# Patient Record
Sex: Female | Born: 2000 | Race: White | Hispanic: No | Marital: Single | Smoking: Never smoker
Health system: Southern US, Community
[De-identification: ages and names within clinical notes are randomized; demographics above are authoritative.]

## PROBLEM LIST (undated history)

## (undated) HISTORY — PX: MOUTH SURGERY: SHX715

---
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
## 2018-12-19 ENCOUNTER — Emergency Department (HOSPITAL_COMMUNITY)
Admission: EM | Admit: 2018-12-19 | Discharge: 2018-12-19 | Disposition: A | Discharge status: Home / Self Care | Payer: 59 | Attending: Emergency Medicine | Admitting: Emergency Medicine

## 2018-12-19 ENCOUNTER — Other Ambulatory Visit: Payer: Self-pay

## 2018-12-19 ENCOUNTER — Emergency Department (HOSPITAL_COMMUNITY): Payer: 59

## 2018-12-19 DIAGNOSIS — Z79899 Other long term (current) drug therapy: Secondary | ICD-10-CM | POA: Diagnosis not present

## 2018-12-19 DIAGNOSIS — Y939 Activity, unspecified: Secondary | ICD-10-CM | POA: Diagnosis not present

## 2018-12-19 DIAGNOSIS — S62022A Displaced fracture of middle third of navicular [scaphoid] bone of left wrist, initial encounter for closed fracture: Secondary | ICD-10-CM

## 2018-12-19 DIAGNOSIS — Y999 Unspecified external cause status: Secondary | ICD-10-CM | POA: Insufficient documentation

## 2018-12-19 DIAGNOSIS — S6992XA Unspecified injury of left wrist, hand and finger(s), initial encounter: Secondary | ICD-10-CM | POA: Diagnosis present

## 2018-12-19 DIAGNOSIS — Y92410 Unspecified street and highway as the place of occurrence of the external cause: Secondary | ICD-10-CM | POA: Diagnosis not present

## 2018-12-19 DIAGNOSIS — S62024A Nondisplaced fracture of middle third of navicular [scaphoid] bone of right wrist, initial encounter for closed fracture: Secondary | ICD-10-CM | POA: Insufficient documentation

## 2018-12-19 MED ORDER — HYDROCODONE-ACETAMINOPHEN 5-325 MG PO TABS: 2.0000 | ORAL_TABLET | ORAL | 0 refills | Status: AC | PRN

## 2018-12-19 MED ORDER — HYDROCODONE-ACETAMINOPHEN 5-325 MG PO TABS
1.0000 | ORAL_TABLET | Freq: Once | ORAL | Status: AC
  Administered 2018-12-19: 22:00:00 1 via ORAL
  Filled 2018-12-19: qty 1

## 2018-12-19 NOTE — ED Notes (Signed)
An After Visit Summary was printed and given to the patient. Discharge instructions given and no further questions at this time.  
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
## 2018-12-19 NOTE — Discharge Instructions (Signed)
Your left wrist was placed today on a splint, please keep this on until you follow-up with Ortho.  Dr. Dallie Piles, orthopedist contact information is attached to your chart, please schedule an appointment for further management of your scaphoid fracture.  I have provided a short course of pain medication, do not drink alcohol or drive while taking this medication as he can make you drowsy.

## 2018-12-24 ENCOUNTER — Ambulatory Visit
Admission: RE | Admit: 2018-12-24 | Discharge: 2018-12-24 | Disposition: A | Discharge status: Home / Self Care | Payer: 59 | Source: Ambulatory Visit | Attending: Orthopedic Surgery | Admitting: Orthopedic Surgery

## 2018-12-24 ENCOUNTER — Other Ambulatory Visit: Payer: Self-pay | Admitting: Orthopedic Surgery

## 2018-12-24 ENCOUNTER — Other Ambulatory Visit: Payer: Self-pay

## 2018-12-24 DIAGNOSIS — S62035A Nondisplaced fracture of proximal third of navicular [scaphoid] bone of left wrist, initial encounter for closed fracture: Secondary | ICD-10-CM

## 2020-06-28 ENCOUNTER — Encounter (HOSPITAL_COMMUNITY): Payer: Self-pay

## 2020-06-28 ENCOUNTER — Other Ambulatory Visit: Payer: Self-pay

## 2020-06-28 ENCOUNTER — Ambulatory Visit (HOSPITAL_COMMUNITY)
Admission: EM | Admit: 2020-06-28 | Discharge: 2020-06-28 | Disposition: A | Discharge status: Home / Self Care | Payer: 59 | Attending: Family Medicine | Admitting: Family Medicine

## 2020-06-28 DIAGNOSIS — S61214A Laceration without foreign body of right ring finger without damage to nail, initial encounter: Secondary | ICD-10-CM

## 2020-06-28 MED ORDER — LIDOCAINE HCL 2 % IJ SOLN
INTRAMUSCULAR | Status: AC
  Filled 2020-06-28: qty 20

## 2020-06-28 NOTE — ED Triage Notes (Signed)
Pt in with c/o right ring finger laceration that happened today when she was opening a can of soup  States she thinks her last tetanus shot was about 4-5 years ago

## 2020-07-01 NOTE — ED Provider Notes (Signed)
  [facility]   [id number] 06/28/20 Arrival Time: 1746  ASSESSMENT & PLAN:  1. Laceration of right ring finger without foreign body without damage to nail, initial encounter      Procedure: Verbal consent obtained. Patient provided with risks and alternatives to the procedure. Wound copiously irrigated with NS then cleansed with betadine. Digital block: Lidocaine 1% without epinephrine. Wound carefully explored. No foreign body, tendon injury, or nonviable tissue were noted. Using sterile technique, 2 interrupted 5-0 Prolene sutures were placed to reapproximate the wound. Procedure tolerated well. No complications. Minimal bleeding. Advised to look for and return for any signs of infection such as redness, swelling, discharge, or worsening pain. Return for suture removal in 7 days.   Follow-up Information    [facility].   Specialty: Urgent Care Why: Return in 7 days for suture removal. Contact information: [address] [place] [address] [phone]              Reviewed expectations re: course of current medical issues. Questions answered. Outlined signs and symptoms indicating need for more acute intervention. Patient verbalized understanding. After Visit Summary given.   SUBJECTIVE:  Barbara Vaughn is a 20 y.o. female who presents with a laceration of R 4th finger. Today. Cut on can. Bleeding controlled. No ROM described. No extremity sensation changes or weakness.   Td UTD: 4-5 y ago.  OBJECTIVE:  Vitals:   06/28/20 1837  BP: 120/87  Pulse: 93  Resp: 17  Temp: 98.1 F (36.7 C)  SpO2: 100%     General appearance: alert; no distress R 4th finger: linear laceration of volar mid finger; size: approx 1 cm; clean wound edges, no foreign bodies; without active bleeding; ROM is limited secondary to reported pain Psychological: alert and cooperative; normal mood and affect    No Known Allergies  History  reviewed. No pertinent past medical history. Social History   Socioeconomic History  . Marital status: Single    Spouse name: Not on file  . Number of children: Not on file  . Years of education: Not on file  . Highest education level: Not on file  Occupational History  . Not on file  Tobacco Use  . Smoking status: Never Smoker  . Smokeless tobacco: Never Used  Substance and Sexual Activity  . Alcohol use: Yes    Comment: rare  . Drug use: No  . Sexual activity: Not on file  Other Topics Concern  . Not on file  Social History Narrative  . Not on file   Social Determinants of Health   Financial Resource Strain: Not on file  Food Insecurity: Not on file  Transportation Needs: Not on file  Physical Activity: Not on file  Stress: Not on file  Social Connections: Not on file         Mardella Layman, MD 07/01/20 [phone]

## 2020-07-06 ENCOUNTER — Other Ambulatory Visit: Payer: Self-pay

## 2020-07-06 ENCOUNTER — Ambulatory Visit (HOSPITAL_COMMUNITY)
Admission: RE | Admit: 2020-07-06 | Discharge: 2020-07-06 | Disposition: A | Discharge status: Home / Self Care | Payer: 59 | Source: Ambulatory Visit

## 2020-07-06 DIAGNOSIS — Z4802 Encounter for removal of sutures: Secondary | ICD-10-CM

## 2020-07-06 NOTE — ED Triage Notes (Signed)
Pt is here for suture removal. Pt denies any pain. Pt denied any bleeding or discharge.
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
## 2022-03-31 ENCOUNTER — Encounter (HOSPITAL_COMMUNITY): Payer: Self-pay

## 2022-03-31 ENCOUNTER — Emergency Department (HOSPITAL_COMMUNITY)
Admission: EM | Admit: 2022-03-31 | Discharge: 2022-04-01 | Disposition: A | Discharge status: Home / Self Care | Payer: 59 | Attending: Student | Admitting: Student

## 2022-03-31 ENCOUNTER — Other Ambulatory Visit: Payer: Self-pay

## 2022-03-31 ENCOUNTER — Emergency Department (HOSPITAL_COMMUNITY): Payer: 59

## 2022-03-31 DIAGNOSIS — K8050 Calculus of bile duct without cholangitis or cholecystitis without obstruction: Secondary | ICD-10-CM

## 2022-03-31 DIAGNOSIS — K745 Biliary cirrhosis, unspecified: Secondary | ICD-10-CM | POA: Diagnosis not present

## 2022-03-31 DIAGNOSIS — R1011 Right upper quadrant pain: Secondary | ICD-10-CM | POA: Diagnosis present

## 2022-03-31 LAB — CBC WITH DIFFERENTIAL/PLATELET
Abs Immature Granulocytes: 0.02 10*3/uL (ref 0.00–0.07)
Basophils Absolute: 0.1 10*3/uL (ref 0.0–0.1)
Basophils Relative: 1 %
Eosinophils Absolute: 0.3 10*3/uL (ref 0.0–0.5)
Eosinophils Relative: 2 %
HCT: 40.6 % (ref 36.0–46.0)
Hemoglobin: 13.6 g/dL (ref 12.0–15.0)
Immature Granulocytes: 0 %
Lymphocytes Relative: 23 %
Lymphs Abs: 2.6 10*3/uL (ref 0.7–4.0)
MCH: 29.6 pg (ref 26.0–34.0)
MCHC: 33.5 g/dL (ref 30.0–36.0)
MCV: 88.5 fL (ref 80.0–100.0)
Monocytes Absolute: 0.9 10*3/uL (ref 0.1–1.0)
Monocytes Relative: 8 %
Neutro Abs: 7.3 10*3/uL (ref 1.7–7.7)
Neutrophils Relative %: 66 %
Platelets: 379 10*3/uL (ref 150–400)
RBC: 4.59 MIL/uL (ref 3.87–5.11)
RDW: 11.9 % (ref 11.5–15.5)
WBC: 11 10*3/uL — ABNORMAL HIGH (ref 4.0–10.5)
nRBC: 0 % (ref 0.0–0.2)

## 2022-03-31 LAB — I-STAT CHEM 8, ED
BUN: 7 mg/dL (ref 6–20)
Calcium, Ion: 1.24 mmol/L (ref 1.15–1.40)
Chloride: 102 mmol/L (ref 98–111)
Creatinine, Ser: 0.8 mg/dL (ref 0.44–1.00)
Glucose, Bld: 98 mg/dL (ref 70–99)
HCT: 40 % (ref 36.0–46.0)
Hemoglobin: 13.6 g/dL (ref 12.0–15.0)
Potassium: 4 mmol/L (ref 3.5–5.1)
Sodium: 140 mmol/L (ref 135–145)
TCO2: 25 mmol/L (ref 22–32)

## 2022-03-31 LAB — URINALYSIS, ROUTINE W REFLEX MICROSCOPIC
Bilirubin Urine: NEGATIVE
Glucose, UA: NEGATIVE mg/dL
Ketones, ur: NEGATIVE mg/dL
Leukocytes,Ua: NEGATIVE
Nitrite: NEGATIVE
Protein, ur: NEGATIVE mg/dL
Specific Gravity, Urine: 1.011 (ref 1.005–1.030)
pH: 6 (ref 5.0–8.0)

## 2022-03-31 LAB — LIPASE, BLOOD: Lipase: 33 U/L (ref 11–51)

## 2022-03-31 LAB — COMPREHENSIVE METABOLIC PANEL WITH GFR
ALT: 17 U/L (ref 0–44)
AST: 20 U/L (ref 15–41)
Albumin: 4.1 g/dL (ref 3.5–5.0)
Alkaline Phosphatase: 94 U/L (ref 38–126)
Anion gap: 12 (ref 5–15)
BUN: 7 mg/dL (ref 6–20)
CO2: 24 mmol/L (ref 22–32)
Calcium: 9.4 mg/dL (ref 8.9–10.3)
Chloride: 102 mmol/L (ref 98–111)
Creatinine, Ser: 0.81 mg/dL (ref 0.44–1.00)
GFR, Estimated: >60 mL/min (ref >60)
Glucose, Bld: 100 mg/dL — ABNORMAL HIGH (ref 70–99)
Potassium: 4 mmol/L (ref 3.5–5.1)
Sodium: 138 mmol/L (ref 135–145)
Total Bilirubin: 0.3 mg/dL (ref 0.3–1.2)
Total Protein: 7.4 g/dL (ref 6.5–8.1)

## 2022-03-31 LAB — I-STAT BETA HCG BLOOD, ED (MC, WL, AP ONLY): I-stat hCG, quantitative: <5 m[IU]/mL (ref <5)

## 2022-03-31 MED ORDER — IOHEXOL 350 MG/ML SOLN
75.0000 mL | Freq: Once | INTRAVENOUS | Status: AC | PRN
  Administered 2022-03-31: 75 mL via INTRAVENOUS

## 2022-03-31 NOTE — ED Triage Notes (Signed)
Pt arrived complaining of abdominal pain that started about 5hrs ago. Pt denies n/v/d  Pain is on lower right abdomen   Pt took tramadol without relief

## 2022-03-31 NOTE — ED Provider Triage Note (Signed)
Emergency Medicine Provider Triage Evaluation Note  Barbara Vaughn , a 21 y.o. female  was evaluated in triage.  Pt complains of right lower quadrant abdominal pain which began approximately 5 hours ago.  Patient states she tried Pepto-Bismol at home with no relief.  She also tried tramadol with no improvement in symptoms.  She denies nausea, vomiting, diarrhea, vaginal bleeding, dysuria, hematuria.  Endorses right lower quadrant pain which does not seem to be aggravated by movement, denies anorexia  Review of Systems  Positive: As above Negative: As above  Physical Exam  BP (!) 143/100 (BP Location: Right Arm)   Pulse (!) 104   Temp 98.6 F (37 C)   Resp 17   SpO2 96%  Gen:   Awake, no distress   Resp:  Normal effort  MSK:   Moves extremities without difficulty  Other:  RLQ tenderness to palpation  Medical Decision Making  Medically screening exam initiated at 8:43 PM.  Appropriate orders placed.  Barbara Vaughn was informed that the remainder of the evaluation will be completed by another provider, this initial triage assessment does not replace that evaluation, and the importance of remaining in the ED until their evaluation is complete.     Darrick Grinder, PA-C 03/31/22 2048

## 2022-04-01 MED ORDER — DICYCLOMINE HCL 20 MG PO TABS: 20.0000 mg | ORAL_TABLET | Freq: Four times a day (QID) | ORAL | 0 refills | Status: DC | PRN

## 2022-04-01 MED ORDER — DICYCLOMINE HCL 20 MG PO TABS: 20.0000 mg | ORAL_TABLET | Freq: Four times a day (QID) | ORAL | 0 refills | Status: AC | PRN

## 2022-04-01 NOTE — Discharge Instructions (Signed)
Please follow-up with your primary care physician about your recurrent right upper quadrant abdominal pain.  Discharging with a medication for abdominal pain called Bentyl.  You may take this if you are experiencing the same symptoms of pain that brought you to the ER today.  Please follow the instructions as gust in the attachment that includes some dietary modifications.  Remember to eat small meals 6 times a day instead of 3 big meals.  Contact a health care provider if: Your pain lasts more than 5 hours. You vomit. You have a fever and chills. Your pain gets worse. Get help right away if: Your skin or the whites of your eyes look yellow (jaundice). Your have tea-colored urine and light-colored stools (feces). You are dizzy or you faint.

## 2022-04-01 NOTE — ED Provider Notes (Signed)
[facility] EMERGENCY DEPARTMENT Provider Note   CSN: [id number] Arrival date & time: 03/31/22  2020     History  Chief Complaint  Patient presents with   Abdominal Pain    Barbara Vaughn is a 21 y.o. female.   Abdominal Pain Pain location:  RUQ Pain quality: aching and gnawing   Pain radiates to:  LUQ and R leg Pain severity:  Severe Onset quality:  Gradual Timing:  Constant Progression:  Waxing and waning (now resolved) Chronicity:  Recurrent (previous episodes (2-3) but never lasted this long) Context: not previous surgeries   Relieved by:  Nothing Worsened by:  Nothing Ineffective treatments: tramadol, pepto w/o relief. Associated symptoms: no nausea and no vomiting        Home Medications Prior to Admission medications   Medication Sig Start Date End Date Taking? Authorizing Provider  Acetaminophen (MIDOL PO) Take 1 tablet by mouth daily as needed (pain, cramping).    [provider]  metaxalone (SKELAXIN) 800 MG tablet Take 400-800 mg by mouth 3 (three) times daily as needed for headache. 11/21/18   [provider]  ondansetron (ZOFRAN) 4 MG tablet Take 1 tablet (4 mg total) by mouth every 6 (six) hours. Patient not taking: Reported on 12/19/2018 05/05/17   McDonald, Mia A, PA-C  sertraline (ZOLOFT) 50 MG tablet Take 50 mg by mouth daily. 08/11/18   [provider]      Allergies    Patient has no known allergies.    Review of Systems   Review of Systems  Gastrointestinal:  Positive for abdominal pain. Negative for nausea and vomiting.    Physical Exam Updated Vital Signs BP 129/79   Pulse 87   Temp 98.6 F (37 C)   Resp 16   Ht 5\' 3"  (1.6 m)   Wt 84.8 kg   LMP 03/17/2022 (Approximate)   SpO2 98%   BMI 33.13 kg/m  Physical Exam Vitals and nursing note reviewed.  Constitutional:      General: She is not in acute distress.    Appearance: She is well-developed. She is not diaphoretic.  HENT:     Head:  Normocephalic and atraumatic.     Right Ear: External ear normal.     Left Ear: External ear normal.     Nose: Nose normal.     Mouth/Throat:     Mouth: Mucous membranes are moist.  Eyes:     General: No scleral icterus.    Conjunctiva/sclera: Conjunctivae normal.  Cardiovascular:     Rate and Rhythm: Normal rate and regular rhythm.     Heart sounds: Normal heart sounds. No murmur heard.    No friction rub. No gallop.  Pulmonary:     Effort: Pulmonary effort is normal. No respiratory distress.     Breath sounds: Normal breath sounds.  Abdominal:     General: Bowel sounds are normal. There is no distension.     Palpations: Abdomen is soft. There is no mass.     Tenderness: There is no abdominal tenderness. There is no guarding.  Musculoskeletal:     Cervical back: Normal range of motion.  Skin:    General: Skin is warm and dry.  Neurological:     Mental Status: She is alert and oriented to person, place, and time.  Psychiatric:        Behavior: Behavior normal.     ED Results / Procedures / Treatments   Labs (all labs ordered are listed, but only  abnormal results are displayed) Labs Reviewed  CBC WITH DIFFERENTIAL/PLATELET - Abnormal; Notable for the following components:      Result Value   WBC 11.0 (*)    All other components within normal limits  COMPREHENSIVE METABOLIC PANEL - Abnormal; Notable for the following components:   Glucose, Bld 100 (*)    All other components within normal limits  URINALYSIS, ROUTINE W REFLEX MICROSCOPIC - Abnormal; Notable for the following components:   Color, Urine STRAW (*)    Hgb urine dipstick SMALL (*)    Bacteria, UA RARE (*)    All other components within normal limits  LIPASE, BLOOD  I-STAT CHEM 8, ED  I-STAT BETA HCG BLOOD, ED ([facility], [facility], [facility] ONLY)    EKG None  Radiology CT Abdomen Pelvis W Contrast  Result Date: 03/31/2022 CLINICAL DATA:  Right lower quadrant abdominal pain. Abdominal pain that started about 5 hours  ago. EXAM: CT ABDOMEN AND PELVIS WITH CONTRAST TECHNIQUE: Multidetector CT imaging of the abdomen and pelvis was performed using the standard protocol following bolus administration of intravenous contrast. RADIATION DOSE REDUCTION: This exam was performed according to the departmental dose-optimization program which includes automated exposure control, adjustment of the mA and/or kV according to patient size and/or use of iterative reconstruction technique. CONTRAST:  83mL OMNIPAQUE IOHEXOL 350 MG/ML SOLN COMPARISON:  None Available. FINDINGS: Lower chest: No acute abnormality. Hepatobiliary: No focal liver abnormality is seen. Gallbladder is contracted. No gallstones, gallbladder wall thickening, or biliary dilatation. Pancreas: Unremarkable. No pancreatic ductal dilatation or surrounding inflammatory changes. Spleen: Normal in size without focal abnormality. Adrenals/Urinary Tract: Adrenal glands are unremarkable. Kidneys are normal, without renal calculi, focal lesion, or hydronephrosis. Bladder is unremarkable. Stomach/Bowel: Stomach is within normal limits. Appendix appears normal. No evidence of bowel wall thickening, distention, or inflammatory changes. Vascular/Lymphatic: No significant vascular findings are present. No enlarged abdominal or pelvic lymph nodes. Reproductive: Uterus and bilateral adnexa are unremarkable. Other: No abdominal wall hernia or abnormality. No abdominopelvic ascites. Musculoskeletal: No acute or significant osseous findings. IMPRESSION: 1. No CT evidence of acute abdominal/pelvic process. 2. Normal appendix. No evidence of colitis or diverticulitis. 3. No evidence of nephrolithiasis or hydronephrosis. Electronically Signed   By: Larose Hires D.O.   On: 03/31/2022 23:52    Procedures Procedures    Medications Ordered in ED Medications  iohexol (OMNIPAQUE) 350 MG/ML injection 75 mL (75 mLs Intravenous Contrast Given 03/31/22 2345)    ED Course/ Medical Decision Making/  A&P Clinical Course as of 04/01/22 0855  Mon Apr 01, 2022  0854 Urinalysis, Routine w reflex microscopic(!) [AH]  0854 I-Stat Beta hCG blood, ED ([facility], [facility], [facility] only) [AH]  0854 I-stat chem 8, ED (not at [facility], [facility] or [facility]) [AH]  0854 CBC with Differential(!) [AH]  0854 Comprehensive metabolic panel(!) [AH]    Clinical Course User Index [AH] Arthor Captain, PA-C                           Medical Decision Making 21 y/o F here with recurrent RUQ pain. The emergent DDX for RUQ pain includes but is not limited to Glabladder disease, PUD, Acute Hepatitis, Pancreatitis, pyelonephritis, Pneumonia, Lower lobe PE/Infarct, Kidney stone, GERD, retrocecal appendicitis, Fitz-Hugh-Curtis syndrome, AAA, MI, Zoster.  The patient has had several episodes of the same type of pain and has just never lasted as long.  Is now completely resolved.  I suspect the patient is having biliary colic.  I visualized and interpreted a CT scan of the abdomen which shows no abnormal findings.  I interpreted the patient's labs which shows negative pregnancy urine within normal limits, CBC mildly elevated 11 likely acute phase reaction due to pain.  Blood sugar of 100 also likely acute phase reaction.  Lipase within normal limits.   Given the workup, benign abdominal exam and findings I think patient is safe to be discharged to follow-up with PCP for further outpatient imaging and workup.  She may follow dietary and lifestyle modifications.  Discharged with Bentyl for antispasmodic pain.  Discussed return precautions.  Amount and/or Complexity of Data Reviewed Radiology: independent interpretation performed.           Final Clinical Impression(s) / ED Diagnoses Final diagnoses:  Biliary colic    Rx / DC Orders ED Discharge Orders     None         Arthor Captain, PA-C 04/01/22 0859    Glendora Score, MD 04/01/22 2021
